# Patient Record
Sex: Female | Born: 2000 | Race: Black or African American | Hispanic: No | Marital: Single | Smoking: Never smoker
Health system: Southern US, Community
[De-identification: ages and names within clinical notes are randomized; demographics above are authoritative.]

## PROBLEM LIST (undated history)

## (undated) DIAGNOSIS — K5900 Constipation, unspecified: Secondary | ICD-10-CM

## (undated) DIAGNOSIS — J45909 Unspecified asthma, uncomplicated: Secondary | ICD-10-CM

## (undated) DIAGNOSIS — K529 Noninfective gastroenteritis and colitis, unspecified: Secondary | ICD-10-CM

---
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
## 2022-05-22 HISTORY — PX: LAPAROSCOPY: SHX197

## 2022-07-10 ENCOUNTER — Inpatient Hospital Stay: Admission: RE | Admit: 2022-07-10 | Payer: Self-pay | Source: Ambulatory Visit

## 2022-07-11 ENCOUNTER — Ambulatory Visit: Payer: Self-pay

## 2022-09-03 ENCOUNTER — Encounter (HOSPITAL_COMMUNITY): Payer: Self-pay

## 2022-09-03 ENCOUNTER — Ambulatory Visit (HOSPITAL_COMMUNITY)
Admission: EM | Admit: 2022-09-03 | Discharge: 2022-09-03 | Disposition: A | Discharge status: Home / Self Care | Payer: Medicaid Other | Attending: Physician Assistant | Admitting: Physician Assistant

## 2022-09-03 DIAGNOSIS — J029 Acute pharyngitis, unspecified: Secondary | ICD-10-CM

## 2022-09-03 DIAGNOSIS — J020 Streptococcal pharyngitis: Secondary | ICD-10-CM

## 2022-09-03 LAB — POCT RAPID STREP A, ED / UC: Streptococcus, Group A Screen (Direct): POSITIVE — AB

## 2022-09-03 MED ORDER — AMOXICILLIN 500 MG PO CAPS: 500.0000 mg | ORAL_CAPSULE | Freq: Two times a day (BID) | ORAL | 0 refills | Status: AC

## 2022-09-03 NOTE — ED Triage Notes (Signed)
Patient c/o generalized body aches, sore throat, and a headache since last night.  Patient states she took OTC cold and flu medicine and last dose was 1 hour ago.

## 2022-09-03 NOTE — Discharge Instructions (Signed)
You tested positive for strep pharyngitis.  Start amoxicillin twice daily as prescribed.  Use over-the-counter medications including Tylenol and ibuprofen for pain.  Gargle with warm salt water.  Throw your toothbrush a few days after starting medication to prevent reinfection.  Follow-up with primary care if symptoms do not improve significantly in the next couple of days.  You are contagious for 24 hours after starting medication so I have provided an excuse note.  If you have any worsening symptoms including high fever, difficulty swallowing, swelling of your throat, shortness of breath, muffled voice you need to be seen immediately.  

## 2022-09-03 NOTE — ED Provider Notes (Signed)
[name]    CSN: [id number] Arrival date & time: 09/03/22  1545      History   Chief Complaint Chief Complaint  Patient presents with   Sore Throat   Headache   Generalized Body Aches    HPI Kara Long is a 22 y.o. female.   Patient presents today with a 2-day history of severe sore throat.  She reports associated generalized body aches and headache but denies any associated fever, cough, congestion, nausea, vomiting, diarrhea.  She reports that pain is rated 10 on a 0-10 pain scale, described as sharp, worse with swallowing or speaking, no alleviating factors identified.  She has had strep in the past with similar presentation.  She has been taking over-the-counter cold and flu medication with last dose approximately 1 hour.  This is provided minimal relief of symptoms.  She does report significant pain with swallowing but denies any dysphagia.  She denies swelling of her throat or shortness of breath.  She is confident she is not pregnant.  Denies any recent antibiotics.    Past Medical History:  Diagnosis Date   Asthma    Constipation    Inflammatory bowel disease     Patient Active Problem List   Diagnosis Date Noted   Colitis 02/13/2018    Past Surgical History:  Procedure Laterality Date   LAPAROSCOPY  05/2022    OB History   No obstetric history on file.      Home Medications    Prior to Admission medications   Medication Sig Start Date End Date Taking? Authorizing Provider  amoxicillin (AMOXIL) 500 MG capsule Take 1 capsule (500 mg total) by mouth 2 (two) times daily. 09/03/22  Yes Kylin Genna K, PA-C  naproxen (NAPROSYN) 500 MG tablet Take 1 tablet (500 mg total) by mouth 2 (two) times daily. 08/15/19   Louanne Skye, MD  albuterol (PROVENTIL HFA;VENTOLIN HFA) 108 (90 Base) MCG/ACT inhaler Inhale 2-4 puffs into the lungs every 4 (four) hours as needed for wheezing or shortness of breath (wheezing or persistent cough). Patient not  taking: Reported on 02/13/2018 03/02/16 08/15/19  Benjamine Sprague, NP  loratadine (CLARITIN) 10 MG tablet Take 1 tablet (10 mg total) by mouth daily. Patient not taking: Reported on 02/13/2018 10/18/14 08/15/19  Drenda Freeze, MD  ranitidine (ZANTAC) 150 MG capsule Take 1 capsule (150 mg total) by mouth daily before breakfast. Patient not taking: Reported on 02/13/2018 08/24/15 08/15/19  Kristen Cardinal, NP    Family History Family History  Problem Relation Age of Onset   Diabetes Mother    Hypertension Father    Asthma Father    Multiple sclerosis Maternal Grandmother    Cancer Maternal Grandfather        pancreatic cancer    Social History Social History   Tobacco Use   Smoking status: Never    Passive exposure: Yes   Smokeless tobacco: Never  Vaping Use   Vaping Use: Never used  Substance Use Topics   Alcohol use: No   Drug use: No     Allergies   Pork-derived products and Blueberry flavor   Review of Systems Review of Systems  Constitutional:  Positive for activity change. Negative for appetite change, fatigue and fever.  HENT:  Positive for sore throat, trouble swallowing and voice change. Negative for congestion, sinus pressure and sneezing.   Respiratory:  Negative for cough and shortness of breath.   Cardiovascular:  Negative for chest pain.  Gastrointestinal:  Negative  for abdominal pain, diarrhea, nausea and vomiting.  Neurological:  Positive for headaches. Negative for dizziness and light-headedness.     Physical Exam Triage Vital Signs ED Triage Vitals  Enc Vitals Group     BP 09/03/22 1619 111/70     Pulse Rate 09/03/22 1619 (!) 110     Resp 09/03/22 1619 16     Temp 09/03/22 1619 99.6 F (37.6 C)     Temp Source 09/03/22 1619 Oral     SpO2 09/03/22 1619 97 %     Weight --      Height --      Head Circumference --      Peak Flow --      Pain Score 09/03/22 1620 7     Pain Loc --      Pain Edu? --      Excl. in [place]? --    No data  found.  Updated Vital Signs BP 111/70 (BP Location: Right Arm)   Pulse (!) 110   Temp 99.6 F (37.6 C) (Oral)   Resp 16   LMP 08/16/2022 (Approximate)   SpO2 97%   Visual Acuity Right Eye Distance:   Left Eye Distance:   Bilateral Distance:    Right Eye Near:   Left Eye Near:    Bilateral Near:     Physical Exam Vitals reviewed.  Constitutional:      General: She is awake. She is not in acute distress.    Appearance: Normal appearance. She is well-developed. She is not ill-appearing.     Comments: Very pleasant female appears stated age in no acute distress sitting comfortably in exam room  HENT:     Head: Normocephalic and atraumatic.     Right Ear: Tympanic membrane, ear canal and external ear normal. Tympanic membrane is not erythematous or bulging.     Left Ear: Tympanic membrane, ear canal and external ear normal. Tympanic membrane is not erythematous or bulging.     Nose:     Right Sinus: No maxillary sinus tenderness or frontal sinus tenderness.     Left Sinus: No maxillary sinus tenderness or frontal sinus tenderness.     Mouth/Throat:     Pharynx: Uvula midline. Posterior oropharyngeal erythema present. No oropharyngeal exudate.     Tonsils: Tonsillar exudate present. No tonsillar abscesses. 3+ on the right. 3+ on the left.  Cardiovascular:     Rate and Rhythm: Normal rate and regular rhythm.     Heart sounds: Normal heart sounds, S1 normal and S2 normal. No murmur heard. Pulmonary:     Effort: Pulmonary effort is normal.     Breath sounds: Normal breath sounds. No wheezing, rhonchi or rales.     Comments: Clear to auscultation bilaterally Psychiatric:        Behavior: Behavior is cooperative.      UC Treatments / Results  Labs (all labs ordered are listed, but only abnormal results are displayed) Labs Reviewed  POCT RAPID STREP A, ED / UC - Abnormal; Notable for the following components:      Result Value   Streptococcus, Group A Screen (Direct)  POSITIVE (*)    All other components within normal limits    EKG   Radiology No results found.  Procedures Procedures (including critical care time)  Medications Ordered in UC Medications - No data to display  Initial Impression / Assessment and Plan / UC Course  I have reviewed the triage vital signs and the nursing notes.  Pertinent labs & imaging results that were available during my care of the patient were reviewed by me and considered in my medical decision making (see chart for details).     Patient is mildly tachycardic but otherwise well-appearing, afebrile, nontoxic.  She does a positive for strep pharyngitis.  She was started on amoxicillin 500 mg twice daily for 10 days.  Discussed that she is to dispose of her toothbrush a few days after starting medication to prevent reinfection.  Recommend that she gargle with warm salt water and alternate over-the-counter medication for symptom management.  Discussed that if she has any worsening symptoms including difficulty swallowing, swelling of her throat, shortness of breath, fever not respond to medication, weakness she needs to be seen immediately.  Strict return precautions given.  Work excuse note provided.  Final Clinical Impressions(s) / UC Diagnoses   Final diagnoses:  Strep pharyngitis  Sore throat     Discharge Instructions      You tested positive for strep pharyngitis.  Start amoxicillin twice daily as prescribed.  Use over-the-counter medications including Tylenol and ibuprofen for pain.  Gargle with warm salt water.  Throw your toothbrush a few days after starting medication to prevent reinfection.  Follow-up with primary care if symptoms do not improve significantly in the next couple of days.  You are contagious for 24 hours after starting medication so I have provided an excuse note.  If you have any worsening symptoms including high fever, difficulty swallowing, swelling of your throat, shortness of breath,  muffled voice you need to be seen immediately.      ED Prescriptions     Medication Sig Dispense Auth. Provider   amoxicillin (AMOXIL) 500 MG capsule Take 1 capsule (500 mg total) by mouth 2 (two) times daily. 20 capsule Okie Jansson, Derry Skill, PA-C      PDMP not reviewed this encounter.   Terrilee Croak, PA-C 09/03/22 1718

## 2023-03-12 ENCOUNTER — Ambulatory Visit (HOSPITAL_COMMUNITY): Payer: Medicaid Other

## 2023-03-12 ENCOUNTER — Ambulatory Visit
Admission: RE | Admit: 2023-03-12 | Discharge: 2023-03-12 | Disposition: A | Discharge status: Home / Self Care | Payer: Medicaid Other | Source: Ambulatory Visit | Attending: Pediatrics | Admitting: Pediatrics

## 2023-03-12 ENCOUNTER — Ambulatory Visit: Payer: Self-pay

## 2023-03-12 VITALS — BP 111/75 | HR 85 | Temp 99.5°F | Resp 16

## 2023-03-12 DIAGNOSIS — J029 Acute pharyngitis, unspecified: Secondary | ICD-10-CM

## 2023-03-12 LAB — POCT RAPID STREP A (OFFICE): Rapid Strep A Screen: NEGATIVE

## 2023-03-12 MED ORDER — NAPROXEN 375 MG PO TABS
375.0000 mg | ORAL_TABLET | Freq: Two times a day (BID) | ORAL | 0 refills | Status: AC
Start: 2023-03-12 — End: ?

## 2023-03-12 NOTE — ED Triage Notes (Signed)
Pt states sore throat for the past 3 days.

## 2023-03-12 NOTE — ED Provider Notes (Signed)
[facility]    CSN: [id number] Arrival date & time: 03/12/23  1325      History   Chief Complaint Chief Complaint  Patient presents with   Sore Throat    I believe I have strep throat - Entered by patient    HPI Kara Long is a 22 y.o. female.   Sore throat x 3 days.  Denies any other sx.  She is taking ibuprofen w/o relief.  Grandparents sick with URI sx (they tested negatvie for COVID).      Past Medical History:  Diagnosis Date   Asthma    Constipation    Inflammatory bowel disease     Patient Active Problem List   Diagnosis Date Noted   Colitis 02/13/2018    Past Surgical History:  Procedure Laterality Date   LAPAROSCOPY  05/2022    OB History   No obstetric history on file.      Home Medications    Prior to Admission medications   Medication Sig Start Date End Date Taking? Authorizing Provider  albuterol (VENTOLIN HFA) 108 (90 Base) MCG/ACT inhaler Inhale into the lungs. 11/06/19  Yes [provider]  naproxen (NAPROSYN) 375 MG tablet Take 1 tablet (375 mg total) by mouth 2 (two) times daily. 03/12/23  Yes Evern Core, PA-C  amoxicillin (AMOXIL) 500 MG capsule Take 1 capsule (500 mg total) by mouth 2 (two) times daily. 09/03/22   Raspet, Noberto Retort, PA-C  loratadine (CLARITIN) 10 MG tablet Take 1 tablet (10 mg total) by mouth daily. Patient not taking: Reported on 02/13/2018 10/18/14 08/15/19  Charlynne Pander, MD  ranitidine (ZANTAC) 150 MG capsule Take 1 capsule (150 mg total) by mouth daily before breakfast. Patient not taking: Reported on 02/13/2018 08/24/15 08/15/19  Lowanda Foster, NP    Family History Family History  Problem Relation Age of Onset   Diabetes Mother    Hypertension Father    Asthma Father    Multiple sclerosis Maternal Grandmother    Cancer Maternal Grandfather        pancreatic cancer    Social History Social History   Tobacco Use   Smoking status: Never    Passive exposure: Yes   Smokeless  tobacco: Never  Vaping Use   Vaping status: Never Used  Substance Use Topics   Alcohol use: No   Drug use: No     Allergies   Pork-derived products and Blueberry flavor   Review of Systems Review of Systems  Constitutional:  Negative for chills, fatigue and fever.  HENT:  Positive for sore throat. Negative for congestion, ear pain, nosebleeds, postnasal drip, rhinorrhea, sinus pressure and sinus pain.   Eyes:  Negative for pain and redness.  Respiratory:  Negative for cough, shortness of breath and wheezing.   Gastrointestinal:  Negative for abdominal pain, diarrhea, nausea and vomiting.  Musculoskeletal:  Negative for arthralgias and myalgias.  Skin:  Negative for rash.  Neurological:  Negative for light-headedness and headaches.  Hematological:  Negative for adenopathy. Does not bruise/bleed easily.  Psychiatric/Behavioral:  Negative for confusion and sleep disturbance.      Physical Exam Triage Vital Signs ED Triage Vitals  Encounter Vitals Group     BP 03/12/23 1335 111/75     Systolic BP Percentile --      Diastolic BP Percentile --      Pulse Rate 03/12/23 1335 85     Resp 03/12/23 1335 16     Temp 03/12/23 1335 99.5 F (37.5  C)     Temp Source 03/12/23 1335 Oral     SpO2 03/12/23 1335 97 %     Weight --      Height --      Head Circumference --      Peak Flow --      Pain Score 03/12/23 1337 8     Pain Loc --      Pain Education --      Exclude from Growth Chart --    No data found.  Updated Vital Signs BP 111/75 (BP Location: Left Arm)   Pulse 85   Temp 99.5 F (37.5 C) (Oral)   Resp 16   LMP  (LMP Unknown)   SpO2 97%   Visual Acuity Right Eye Distance:   Left Eye Distance:   Bilateral Distance:    Right Eye Near:   Left Eye Near:    Bilateral Near:     Physical Exam Vitals and nursing note reviewed.  Constitutional:      General: She is not in acute distress.    Appearance: Normal appearance. She is well-developed. She is obese. She  is not ill-appearing or toxic-appearing.  HENT:     Head: Normocephalic and atraumatic.     Right Ear: Tympanic membrane and ear canal normal. No swelling or tenderness. No middle ear effusion. Tympanic membrane is not injected, scarred, perforated, retracted or bulging.     Left Ear: Tympanic membrane and ear canal normal. No swelling or tenderness.  No middle ear effusion. Tympanic membrane is not injected, scarred, perforated, retracted or bulging.     Nose: No mucosal edema, congestion or rhinorrhea.     Right Turbinates: Not enlarged or swollen.     Left Turbinates: Not enlarged or swollen.     Right Sinus: No frontal sinus tenderness.     Left Sinus: No maxillary sinus tenderness or frontal sinus tenderness.     Comments: Erythematous nasal passages    Mouth/Throat:     Mouth: Mucous membranes are moist.     Pharynx: Oropharynx is clear. Uvula midline. No pharyngeal swelling, oropharyngeal exudate, posterior oropharyngeal erythema or uvula swelling.     Tonsils: No tonsillar exudate or tonsillar abscesses. 0 on the right. 0 on the left.  Eyes:     Extraocular Movements: Extraocular movements intact.     Conjunctiva/sclera: Conjunctivae normal.  Cardiovascular:     Rate and Rhythm: Normal rate and regular rhythm.     Heart sounds: No murmur heard. Pulmonary:     Effort: Pulmonary effort is normal. No respiratory distress.     Breath sounds: Normal breath sounds. No wheezing, rhonchi or rales.  Musculoskeletal:        General: Normal range of motion.     Cervical back: Normal range of motion and neck supple. No rigidity.  Lymphadenopathy:     Cervical: No cervical adenopathy.  Skin:    General: Skin is warm and dry.  Neurological:     General: No focal deficit present.     Mental Status: She is alert and oriented to person, place, and time.     Motor: No weakness.     Gait: Gait normal.  Psychiatric:        Mood and Affect: Mood normal.        Behavior: Behavior normal.       UC Treatments / Results  Labs (all labs ordered are listed, but only abnormal results are displayed) Labs Reviewed  CULTURE, GROUP A  STREP [facility])  POCT RAPID STREP A (OFFICE)    EKG   Radiology No results found.  Procedures Procedures (including critical care time)  Medications Ordered in UC Medications - No data to display  Initial Impression / Assessment and Plan / UC Course  I have reviewed the triage vital signs and the nursing notes.  Pertinent labs & imaging results that were available during my care of the patient were reviewed by me and considered in my medical decision making (see chart for details).     Likely viral pharyngitis, however, I will send out strep culture Final Clinical Impressions(s) / UC Diagnoses   Final diagnoses:  Sore throat     Discharge Instructions      Gargle warm water Take medication as needed     ED Prescriptions     Medication Sig Dispense Auth. Provider   naproxen (NAPROSYN) 375 MG tablet Take 1 tablet (375 mg total) by mouth 2 (two) times daily. 10 tablet Evern Core, PA-C      PDMP not reviewed this encounter.   Evern Core, PA-C 03/12/23 1420

## 2023-03-12 NOTE — Discharge Instructions (Signed)
Gargle warm water Take medication as needed

## 2023-03-15 LAB — CULTURE, GROUP A STREP (THRC)

## 2023-05-13 ENCOUNTER — Ambulatory Visit (HOSPITAL_COMMUNITY)
Admission: EM | Admit: 2023-05-13 | Discharge: 2023-05-13 | Disposition: A | Discharge status: Home / Self Care | Payer: Medicaid Other | Attending: Internal Medicine | Admitting: Internal Medicine

## 2023-05-13 DIAGNOSIS — J069 Acute upper respiratory infection, unspecified: Secondary | ICD-10-CM | POA: Diagnosis present

## 2023-05-13 MED ORDER — DEXAMETHASONE SODIUM PHOSPHATE 10 MG/ML IJ SOLN
10.0000 mg | Freq: Once | INTRAMUSCULAR | Status: AC
  Administered 2023-05-13: 10 mg via INTRAMUSCULAR

## 2023-05-13 MED ORDER — DEXAMETHASONE SODIUM PHOSPHATE 10 MG/ML IJ SOLN
INTRAMUSCULAR | Status: AC
  Filled 2023-05-13: qty 1

## 2023-05-13 MED ORDER — BENZONATATE 100 MG PO CAPS
100.0000 mg | ORAL_CAPSULE | Freq: Three times a day (TID) | ORAL | 0 refills | Status: AC | PRN
Start: 2023-05-13 — End: ?

## 2023-05-13 NOTE — Discharge Instructions (Signed)
Suspect that you have a viral illness as we discussed that should run its course.  You were given a steroid shot today in urgent care to help with symptoms and any asthma flareup.  Continue albuterol inhaler and I have prescribed you cough medication to take as needed as well.  Follow-up if any symptoms persist or worsen.  COVID test is pending.

## 2023-05-13 NOTE — ED Triage Notes (Signed)
Pt presents to the office for cough and nasal congestion x 4 days. Pt is taking OTC medication with no relief.

## 2023-05-13 NOTE — ED Provider Notes (Signed)
[facility]    CSN: [id number] Arrival date & time: 05/13/23  1752      History   Chief Complaint No chief complaint on file.   HPI Kara Long is a 22 y.o. female.   Patient presents with nasal congestion and cough that started 4 days ago.  Reports that she has felt feverish but did not take temperature with thermometer.  Denies any known sick contacts.  She does have a history of asthma and has been using her albuterol inhaler with minimal improvement in symptoms as well as taking over-the-counter cold and flu medications including Mucinex.  Denies any current chest pain or shortness of breath.     Past Medical History:  Diagnosis Date   Asthma    Constipation    Inflammatory bowel disease     Patient Active Problem List   Diagnosis Date Noted   Colitis 02/13/2018    Past Surgical History:  Procedure Laterality Date   LAPAROSCOPY  05/2022    OB History   No obstetric history on file.      Home Medications    Prior to Admission medications   Medication Sig Start Date End Date Taking? Authorizing Provider  benzonatate (TESSALON) 100 MG capsule Take 1 capsule (100 mg total) by mouth every 8 (eight) hours as needed for cough. 05/13/23  Yes Nancy Manuele, Rolly Salter E, FNP  albuterol (VENTOLIN HFA) 108 (90 Base) MCG/ACT inhaler Inhale into the lungs. 11/06/19   [provider]  amoxicillin (AMOXIL) 500 MG capsule Take 1 capsule (500 mg total) by mouth 2 (two) times daily. 09/03/22   Raspet, Noberto Retort, PA-C  naproxen (NAPROSYN) 375 MG tablet Take 1 tablet (375 mg total) by mouth 2 (two) times daily. 03/12/23   Evern Core, PA-C  loratadine (CLARITIN) 10 MG tablet Take 1 tablet (10 mg total) by mouth daily. Patient not taking: Reported on 02/13/2018 10/18/14 08/15/19  Charlynne Pander, MD  ranitidine (ZANTAC) 150 MG capsule Take 1 capsule (150 mg total) by mouth daily before breakfast. Patient not taking: Reported on 02/13/2018 08/24/15 08/15/19  Lowanda Foster, NP    Family History Family History  Problem Relation Age of Onset   Diabetes Mother    Hypertension Father    Asthma Father    Multiple sclerosis Maternal Grandmother    Cancer Maternal Grandfather        pancreatic cancer    Social History Social History   Tobacco Use   Smoking status: Never    Passive exposure: Yes   Smokeless tobacco: Never  Vaping Use   Vaping status: Never Used  Substance Use Topics   Alcohol use: No   Drug use: No     Allergies   Pork-derived products and Blueberry flavor   Review of Systems Review of Systems Per HPI  Physical Exam Triage Vital Signs ED Triage Vitals  Encounter Vitals Group     BP 05/13/23 1837 131/84     Systolic BP Percentile --      Diastolic BP Percentile --      Pulse Rate 05/13/23 1837 88     Resp 05/13/23 1837 16     Temp 05/13/23 1837 98.2 F (36.8 C)     Temp Source 05/13/23 1837 Oral     SpO2 05/13/23 1837 96 %     Weight --      Height --      Head Circumference --      Peak Flow --  Pain Score 05/13/23 1838 0     Pain Loc --      Pain Education --      Exclude from Growth Chart --    No data found.  Updated Vital Signs BP 131/84 (BP Location: Left Arm)   Pulse 88   Temp 98.2 F (36.8 C) (Oral)   Resp 16   LMP 09/21/2022 (Approximate)   SpO2 96%   Visual Acuity Right Eye Distance:   Left Eye Distance:   Bilateral Distance:    Right Eye Near:   Left Eye Near:    Bilateral Near:     Physical Exam Constitutional:      General: She is not in acute distress.    Appearance: Normal appearance. She is not toxic-appearing or diaphoretic.  HENT:     Head: Normocephalic and atraumatic.     Right Ear: Tympanic membrane and ear canal normal.     Left Ear: Tympanic membrane and ear canal normal.     Nose: Congestion present.     Mouth/Throat:     Mouth: Mucous membranes are moist.     Pharynx: No posterior oropharyngeal erythema.  Eyes:     Extraocular Movements: Extraocular  movements intact.     Conjunctiva/sclera: Conjunctivae normal.     Pupils: Pupils are equal, round, and reactive to light.  Cardiovascular:     Rate and Rhythm: Normal rate and regular rhythm.     Pulses: Normal pulses.     Heart sounds: Normal heart sounds.  Pulmonary:     Effort: Pulmonary effort is normal. No respiratory distress.     Breath sounds: Normal breath sounds. No stridor. No wheezing, rhonchi or rales.  Abdominal:     General: Abdomen is flat. Bowel sounds are normal.     Palpations: Abdomen is soft.  Musculoskeletal:        General: Normal range of motion.     Cervical back: Normal range of motion.  Skin:    General: Skin is warm and dry.  Neurological:     General: No focal deficit present.     Mental Status: She is alert and oriented to person, place, and time. Mental status is at baseline.  Psychiatric:        Mood and Affect: Mood normal.        Behavior: Behavior normal.      UC Treatments / Results  Labs (all labs ordered are listed, but only abnormal results are displayed) Labs Reviewed  SARS CORONAVIRUS 2 (TAT 6-24 HRS)    EKG   Radiology No results found.  Procedures Procedures (including critical care time)  Medications Ordered in UC Medications  dexamethasone (DECADRON) injection 10 mg (10 mg Intramuscular Given 05/13/23 1858)    Initial Impression / Assessment and Plan / UC Course  I have reviewed the triage vital signs and the nursing notes.  Pertinent labs & imaging results that were available during my care of the patient were reviewed by me and considered in my medical decision making (see chart for details).     Patient presents with symptoms likely from a viral upper respiratory infection. Patient is nontoxic appearing and not in need of emergent medical intervention.  Possibly very mild asthma flareup but there are no adventitious lung sounds on exam, no tachypnea, and oxygen is normal so do not think that chest imaging is  necessary.  Patient given IM Decadron today in urgent care to help with symptoms.  COVID test pending.  Recommended  symptom control with medications, supportive care, fluids, rest.  Return if symptoms fail to improve in 1-2 weeks or you develop shortness of breath, chest pain, severe headache. Patient states understanding and is agreeable.  Discharged with PCP followup.  Final Clinical Impressions(s) / UC Diagnoses   Final diagnoses:  Viral upper respiratory tract infection with cough     Discharge Instructions      Suspect that you have a viral illness as we discussed that should run its course.  You were given a steroid shot today in urgent care to help with symptoms and any asthma flareup.  Continue albuterol inhaler and I have prescribed you cough medication to take as needed as well.  Follow-up if any symptoms persist or worsen.  COVID test is pending.    ED Prescriptions     Medication Sig Dispense Auth. Provider   benzonatate (TESSALON) 100 MG capsule Take 1 capsule (100 mg total) by mouth every 8 (eight) hours as needed for cough. 21 capsule Dillon, Acie Fredrickson, [place]      PDMP not reviewed this encounter.   Gustavus Bryant, [place] 05/13/23 Ernestina [place]

## 2023-05-14 LAB — SARS CORONAVIRUS 2 (TAT 6-24 HRS): SARS Coronavirus 2: NEGATIVE

## 2023-07-03 IMAGING — DX DG CHEST 2V
2 series · 2 of 2 positions shown · non-contrast
Comparison: None.

CLINICAL DATA: Shortness of breath

EXAM:
CHEST - 2 VIEW

[chest pa]
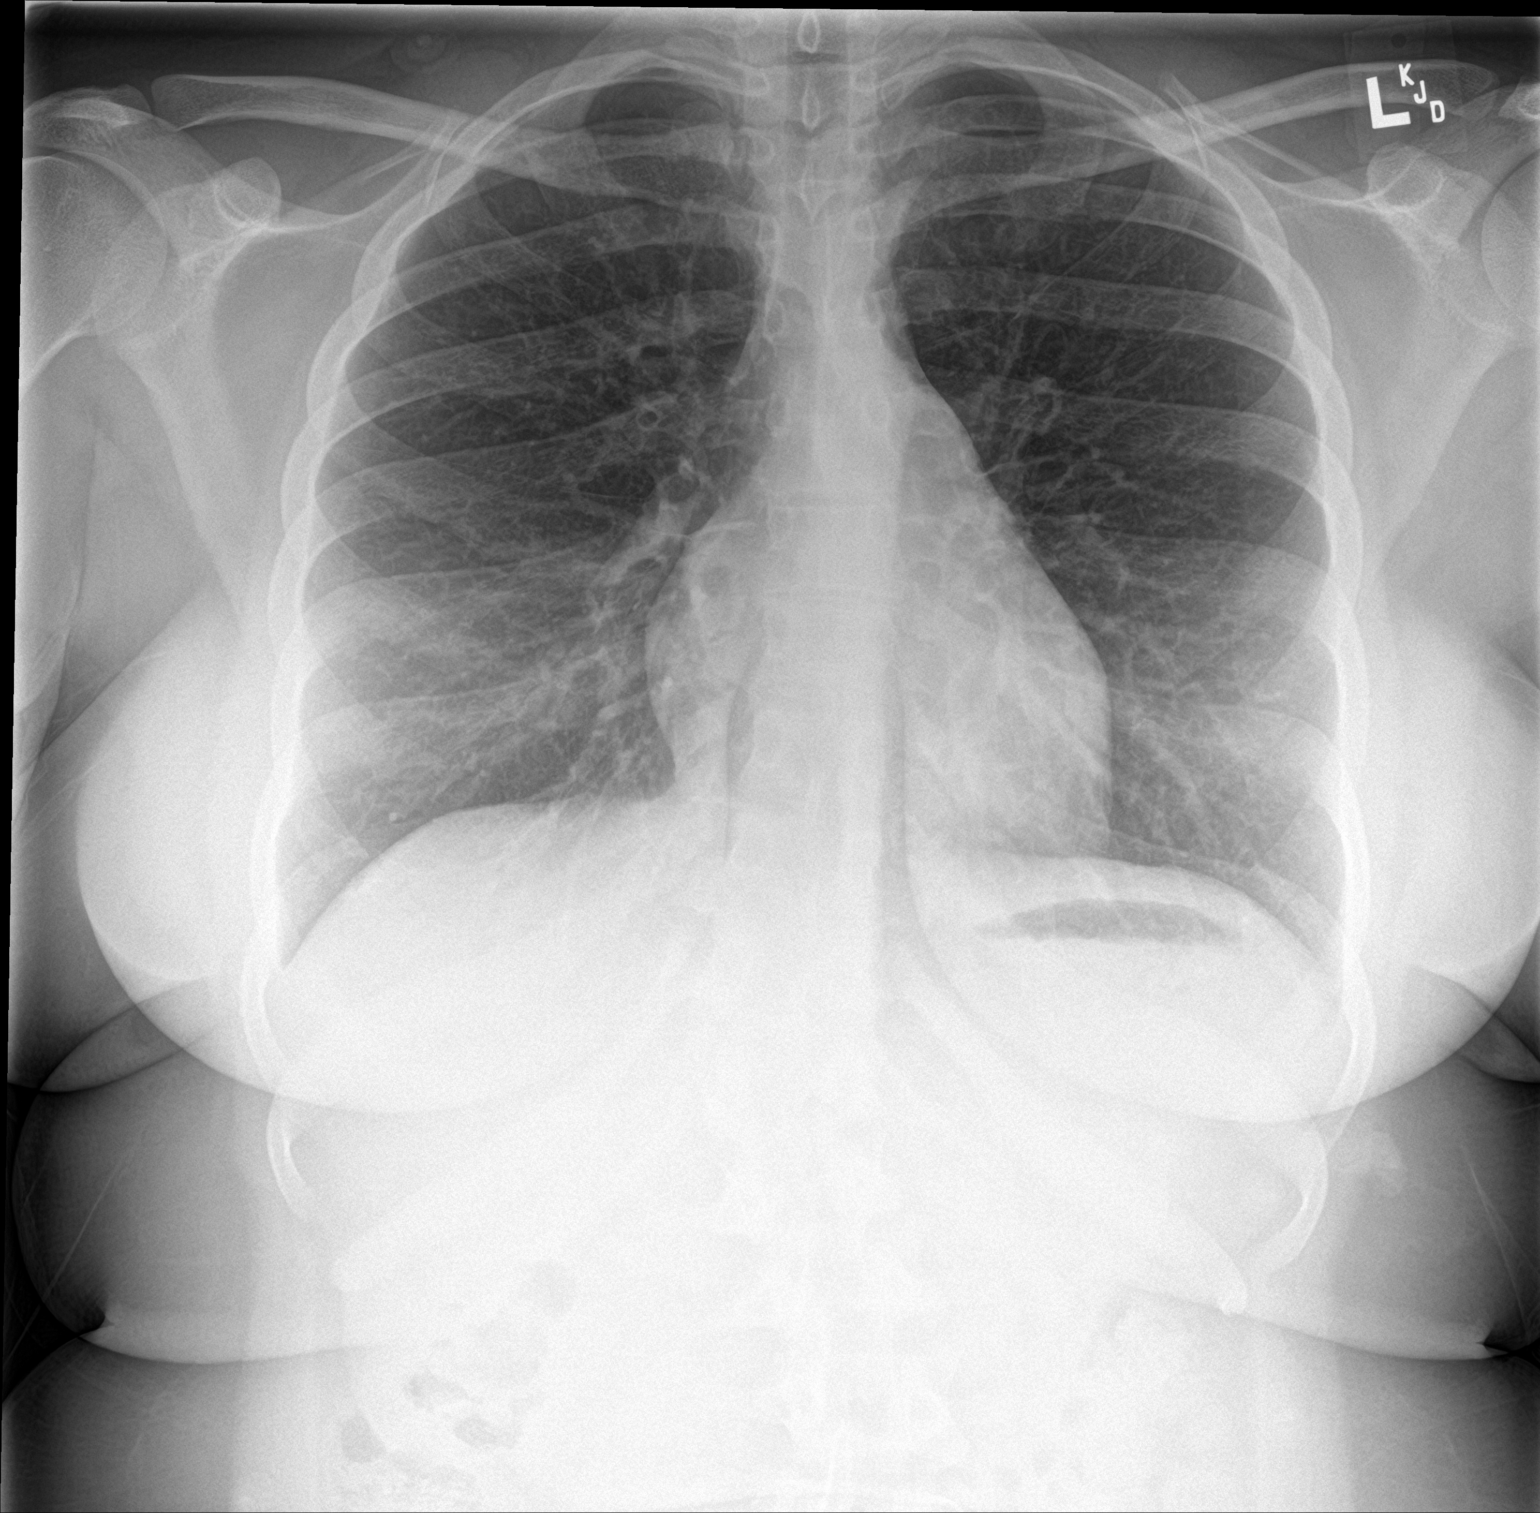

[chest lat]
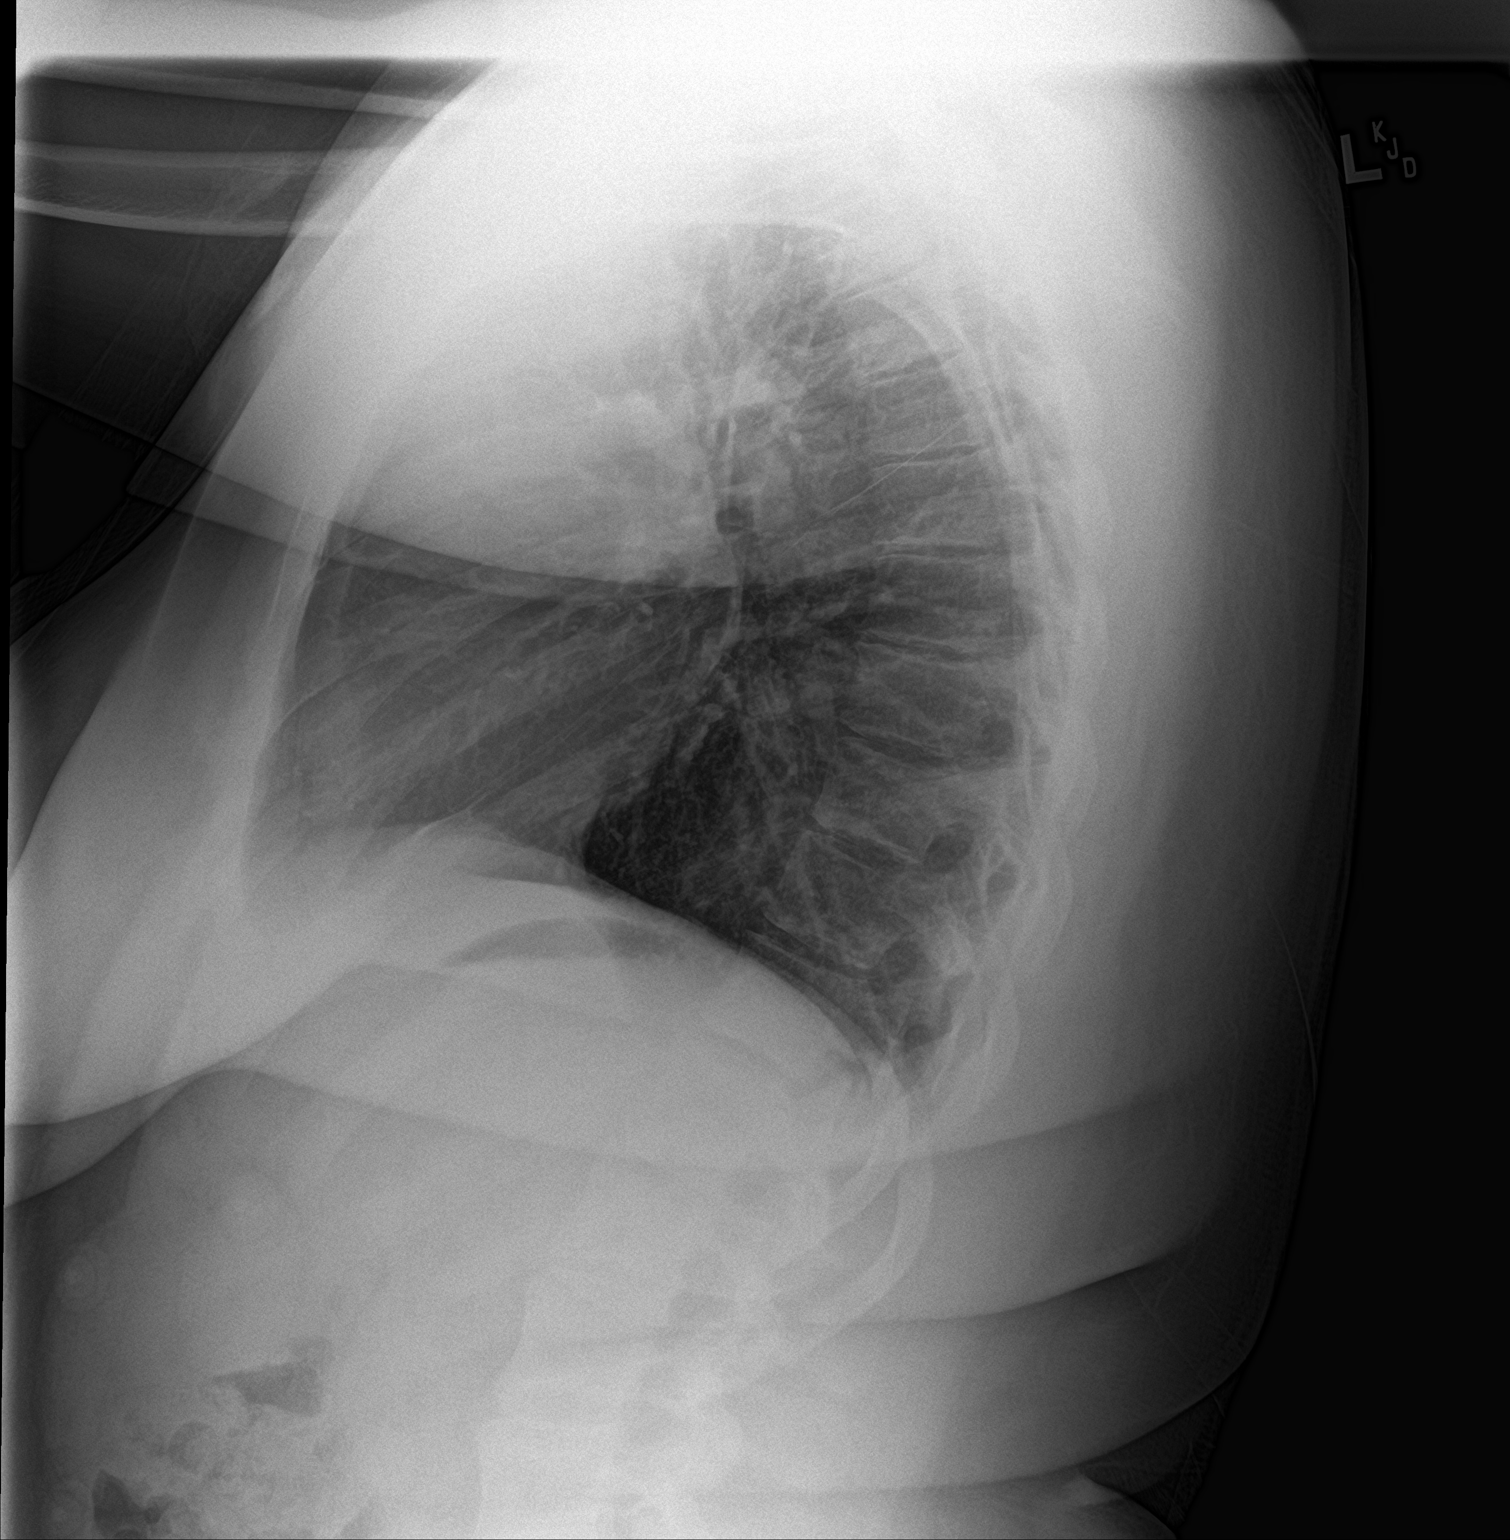

[2 of 2 positions shown; findings below may reference images not displayed]

FINDINGS: The heart size and mediastinal contours are within normal limits.
Both lungs are clear. The visualized skeletal structures are
unremarkable.
IMPRESSION: No active cardiopulmonary disease.

## 2024-02-03 ENCOUNTER — Ambulatory Visit (HOSPITAL_COMMUNITY): Payer: Self-pay

## 2024-07-21 ENCOUNTER — Emergency Department (HOSPITAL_COMMUNITY)
Admission: EM | Admit: 2024-07-21 | Discharge: 2024-07-21 | Disposition: A | Discharge status: Home / Self Care | Payer: Worker's Compensation

## 2024-07-21 ENCOUNTER — Emergency Department (HOSPITAL_COMMUNITY): Payer: Worker's Compensation

## 2024-07-21 DIAGNOSIS — Y990 Civilian activity done for income or pay: Secondary | ICD-10-CM | POA: Insufficient documentation

## 2024-07-21 DIAGNOSIS — M25562 Pain in left knee: Secondary | ICD-10-CM | POA: Insufficient documentation

## 2024-07-21 DIAGNOSIS — W1830XA Fall on same level, unspecified, initial encounter: Secondary | ICD-10-CM | POA: Diagnosis not present

## 2024-07-21 MED ORDER — IBUPROFEN 800 MG PO TABS
800.0000 mg | ORAL_TABLET | Freq: Once | ORAL | Status: AC
  Administered 2024-07-21: 800 mg via ORAL
  Filled 2024-07-21: qty 1

## 2024-07-21 NOTE — ED Triage Notes (Addendum)
 Pt fell at work on ice, pt c/o left knee pain. Pain 10/10 , sensory present , no obvious deformity noted, no discoloration noted

## 2024-07-21 NOTE — ED Provider Notes (Signed)
 " [facility] Provider Note   CSN: [id number] Arrival date & time: 07/21/24  9073     Patient presents with: Fall ( )  HPI Kara Long is a 24 y.o. female presenting for left knee pain after a fall that occurred yesterday.  She states she fell from standing and may have twisted her knee and landed on the anterior portion of her knee.  Brought in by EMS.  States she cannot bear weight on the left leg due to the left knee pain.  Reports that pain is primarily about the medial aspect of the knee joint.  Denies notable swelling or discoloration of the knee.  Does not appear to have obvious deformity.  Has not taken any medicines for her knee pain.  She states that sensation and mobility is intact distally.    Fall       Prior to Admission medications  Medication Sig Start Date End Date Taking? Authorizing Provider  albuterol  (VENTOLIN  HFA) 108 (90 Base) MCG/ACT inhaler Inhale into the lungs. 11/06/19   [provider]  amoxicillin  (AMOXIL ) 500 MG capsule Take 1 capsule (500 mg total) by mouth 2 (two) times daily. 09/03/22   Raspet, Erin K, PA-C  benzonatate  (TESSALON ) 100 MG capsule Take 1 capsule (100 mg total) by mouth every 8 (eight) hours as needed for cough. 05/13/23   Hazen Darryle BRAVO, FNP  naproxen  (NAPROSYN ) 375 MG tablet Take 1 tablet (375 mg total) by mouth 2 (two) times daily. 03/12/23   Juleen Rush, PA-C  loratadine  (CLARITIN ) 10 MG tablet Take 1 tablet (10 mg total) by mouth daily. Patient not taking: Reported on 02/13/2018 10/18/14 08/15/19  Patt Alm Macho, MD  ranitidine  (ZANTAC ) 150 MG capsule Take 1 capsule (150 mg total) by mouth daily before breakfast. Patient not taking: Reported on 02/13/2018 08/24/15 08/15/19  Eilleen Colander, NP    Allergies: Porcine (pork) protein-containing drug products and Blueberry flavoring agent (non-screening)    Review of Systems See HPI  Updated Vital Signs BP (!) 144/88    Pulse (!) 107   Temp 98.5 F (36.9 C) (Oral)   Resp 19   SpO2 98%   Physical Exam Constitutional:      Appearance: Normal appearance.  HENT:     Head: Normocephalic.     Nose: Nose normal.  Eyes:     Conjunctiva/sclera: Conjunctivae normal.  Pulmonary:     Effort: Pulmonary effort is normal.  Musculoskeletal:     Right knee: Normal.     Left knee: No swelling, deformity, effusion, erythema or ecchymosis. Decreased range of motion. Tenderness present over the medial joint line.  Neurological:     Mental Status: She is alert.  Psychiatric:        Mood and Affect: Mood normal.     (all labs ordered are listed, but only abnormal results are displayed) Labs Reviewed - No data to display  EKG: None  Radiology: DG Knee Complete 4 Views Left Result Date: 07/21/2024 EXAM: 4 VIEW(S) XRAY OF THE LEFT KNEE 07/21/2024 01:05:05 PM COMPARISON: None available. CLINICAL HISTORY: Fall; pain. FINDINGS: BONES AND JOINTS: No acute fracture. No malalignment. No significant joint effusion. SOFT TISSUES: Unremarkable. IMPRESSION: 1. No acute fracture or dislocation. Electronically signed by: Waddell Calk MD 07/21/2024 02:26 PM EST RP Workstation: [id number]     Procedures   Medications Ordered in the ED  ibuprofen  (ADVIL ) tablet 800 mg (800 mg Oral Given 07/21/24 1107)  Medical Decision Making Amount and/or Complexity of Data Reviewed Radiology: ordered.  Risk Prescription drug management.   24 year old well-appearing female presenting for left knee pain after a fall yesterday.  Exam was unremarkable but range of motion of the knee was limited due to pain.  Patient refused to ambulate.  X-ray was negative for acute osseous abnormality or evidence of trauma but cannot definitively rule out ligament or meniscal injury.  Placed her knee in a motor belies her and gave her crutches.  Encouraged nonweightbearing activities and Ortho follow-up.  Also  recommended RICE treatment with NSAIDs at home.  Discharged.     Final diagnoses:  Left knee pain, unspecified chronicity    ED Discharge Orders     None          Lang Norleen POUR, PA-C 07/21/24 1437    Simon Lavonia SAILOR, MD 07/21/24 1533  "

## 2024-07-21 NOTE — Progress Notes (Signed)
 Orthopedic Tech Progress Note Patient Details:  Kara Long [birth year] [id number]  Ortho Devices Type of Ortho Device: Knee Immobilizer, Crutches Ortho Device/Splint Location: LLE Ortho Device/Splint Interventions: Application   Post Interventions Patient Tolerated: Well  Massie BRAVO Kara Long 07/21/2024, 4:25 PM

## 2024-07-21 NOTE — Discharge Instructions (Addendum)
 Evaluation for your left knee pain was overall reassuring.  X-ray was negative but it is possible you may have a ligament or meniscal injury.  Please use the knee immobilizer and crutches while you are ambulating.  Also recommend elevation and rest and icing your knee 3-4 times a day.  Please follow-up with orthopedics.  You can take Tylenol  and ibuprofen  for pain at home.
